# Patient Record
Sex: Male | Born: 1980 | Race: White | Hispanic: No | Marital: Married | Smoking: Never smoker
Health system: Southern US, Community
[De-identification: ages and names within clinical notes are randomized; demographics above are authoritative.]

## PROBLEM LIST (undated history)

## (undated) HISTORY — PX: COSMETIC SURGERY: SHX468

## (undated) HISTORY — PX: APPENDECTOMY: SHX54

## (undated) HISTORY — PX: OTHER SURGICAL HISTORY: SHX169

---
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
## 1999-03-22 ENCOUNTER — Encounter: Payer: Self-pay | Admitting: Emergency Medicine

## 1999-03-22 ENCOUNTER — Emergency Department (HOSPITAL_COMMUNITY): Admission: EM | Admit: 1999-03-22 | Discharge: 1999-03-22 | Payer: Self-pay | Admitting: Emergency Medicine

## 2000-08-25 ENCOUNTER — Ambulatory Visit (HOSPITAL_BASED_OUTPATIENT_CLINIC_OR_DEPARTMENT_OTHER): Admission: RE | Admit: 2000-08-25 | Discharge: 2000-08-25 | Payer: Self-pay | Admitting: Orthopedic Surgery

## 2002-09-01 ENCOUNTER — Encounter: Payer: Self-pay | Admitting: Emergency Medicine

## 2002-09-02 ENCOUNTER — Inpatient Hospital Stay (HOSPITAL_COMMUNITY): Admission: EM | Admit: 2002-09-02 | Discharge: 2002-09-02 | Payer: Self-pay | Admitting: Emergency Medicine

## 2002-09-02 ENCOUNTER — Encounter (INDEPENDENT_AMBULATORY_CARE_PROVIDER_SITE_OTHER): Payer: Self-pay | Admitting: *Deleted

## 2003-03-07 ENCOUNTER — Emergency Department (HOSPITAL_COMMUNITY): Admission: EM | Admit: 2003-03-07 | Discharge: 2003-03-08 | Payer: Self-pay | Admitting: Emergency Medicine

## 2003-07-02 ENCOUNTER — Ambulatory Visit (HOSPITAL_COMMUNITY): Admission: RE | Admit: 2003-07-02 | Discharge: 2003-07-02 | Payer: Self-pay | Admitting: General Surgery

## 2003-07-02 ENCOUNTER — Encounter: Payer: Self-pay | Admitting: General Surgery

## 2003-11-25 ENCOUNTER — Emergency Department (HOSPITAL_COMMUNITY): Admission: EM | Admit: 2003-11-25 | Discharge: 2003-11-26 | Payer: Self-pay | Admitting: Emergency Medicine

## 2004-12-08 ENCOUNTER — Ambulatory Visit: Payer: Self-pay | Admitting: Family Medicine

## 2005-01-11 ENCOUNTER — Emergency Department (HOSPITAL_COMMUNITY): Admission: EM | Admit: 2005-01-11 | Discharge: 2005-01-11 | Payer: Self-pay | Admitting: Emergency Medicine

## 2005-03-25 ENCOUNTER — Ambulatory Visit: Payer: Self-pay | Admitting: Family Medicine

## 2006-12-16 ENCOUNTER — Ambulatory Visit: Payer: Self-pay | Admitting: Family Medicine

## 2006-12-17 ENCOUNTER — Ambulatory Visit: Payer: Self-pay | Admitting: Family Medicine

## 2006-12-17 LAB — CONVERTED CEMR LAB
Bilirubin, Direct: 0.1 mg/dL (ref 0.0–0.3)
CO2: 30 meq/L (ref 19–32)
Cholesterol: 150 mg/dL (ref 0–200)
Creatinine, Ser: 0.8 mg/dL (ref 0.4–1.5)
Direct LDL: 76.5 mg/dL
Potassium: 3.7 meq/L (ref 3.5–5.1)
Sodium: 141 meq/L (ref 135–145)
Total Bilirubin: 0.8 mg/dL (ref 0.3–1.2)
Total CHOL/HDL Ratio: 5.3
Total Protein: 6.5 g/dL (ref 6.0–8.3)
Triglycerides: 257 mg/dL (ref 0–149)
VLDL: 51 mg/dL — ABNORMAL HIGH (ref 0–40)

## 2009-11-27 ENCOUNTER — Ambulatory Visit: Payer: Self-pay | Admitting: Family Medicine

## 2009-11-27 LAB — CONVERTED CEMR LAB
Bilirubin Urine: NEGATIVE
Ketones, urine, test strip: NEGATIVE
WBC, UA: 0 cells/hpf
pH: 6

## 2010-10-16 NOTE — Assessment & Plan Note (Signed)
Summary: DOT PHYSICAL / LFW   Vital Signs:  Patient profile:   30 year old male Height:      74 inches Weight:      236.6 pounds BMI:     30.49 Temp:     98.2 degrees F oral Pulse rate:   76 / minute Pulse rhythm:   regular BP sitting:   120 / 82  (left arm) Cuff size:   regular  Vitals Entered By: Benny Lennert CMA Duncan Dull) (November 27, 2009 8:57 AM)  Vision Screening:Left eye w/o correction: 20 / 20 Right Eye w/o correction: 20 / 20 Both eyes w/o correction:  20/ 20  Color vision testing: normal   Ishmael Holter # 2: Pass     Vision Entered By: Benny Lennert CMA Duncan Dull) (November 27, 2009 8:58 AM)  Hearing Screen 25db HL: Left  500 hz: 25db 1000 hz: 25db 2000 hz: 25db 4000 hz: 25db Right  500 hz: 25db 1000 hz: 25db 2000 hz: 25db 4000 hz: 25db    History of Present Illness: Chief complaint dot physical  Preventive Screening-Counseling & Management  Alcohol-Tobacco     Smoking Status: never      Drug Use:  no.    Problems Prior to Update: 1)  Preventive Health Care  (ICD-V70.0)  Current Medications (verified): 1)  None  Allergies (verified): No Known Drug Allergies  Past History:  Past medical, surgical, family and social histories (including risk factors) reviewed, and no changes noted (except as noted below).  Past Surgical History: nose plastic surgery knee arthroscopic surgery, left  Appendectomy  Family History: Reviewed history and no changes required. father: healthy mother: passed away from Alzheimer's...age 63 siblings: healthy  Social History: Reviewed history and no changes required. Occupation: Visual merchandiser Married No children Never Smoked Alcohol use-yes, 1-2 drink a month Drug use-noNo exercise Diet: fruits and veggies, waterOccupation:  employed Smoking Status:  never Drug Use:  no  Review of Systems General:  Denies fatigue and fever. CV:  Denies chest pain or discomfort and difficulty breathing at night. Resp:  Denies  shortness of breath. GI:  Denies abdominal pain. GU:  Denies dysuria. MS:  some joint stiffness on colder days...uses glucosamine. Derm:  Denies lesion(s). Psych:  Denies anxiety and depression.  Physical Exam  General:  Well-developed,well-nourished,in no acute distress; alert,appropriate and cooperative throughout examination Head:  Normocephalic and atraumatic without obvious abnormalities. No apparent alopecia or balding. Eyes:  No corneal or conjunctival inflammation noted. EOMI. Perrla. Funduscopic exam benign, without hemorrhages, exudates or papilledema. Vision grossly normal. Ears:  External ear exam shows no significant lesions or deformities.  Otoscopic examination reveals clear canals, tympanic membranes are intact bilaterally without bulging, retraction, inflammation or discharge. Hearing is grossly normal bilaterally. Nose:  External nasal examination shows no deformity or inflammation. Nasal mucosa are pink and moist without lesions or exudates. Mouth:  Oral mucosa and oropharynx without lesions or exudates.  Teeth in good repair. Neck:  no carotid bruit or thyromegaly no cervical or supraclavicular lymphadenopathy  Lungs:  Normal respiratory effort, chest expands symmetrically. Lungs are clear to auscultation, no crackles or wheezes. Heart:  Normal rate and regular rhythm. S1 and S2 normal without gallop, murmur, click, rub or other extra sounds. Abdomen:  Bowel sounds positive,abdomen soft and non-tender without masses, organomegaly or hernias noted. Genitalia:  Testes bilaterally descended without nodularity, tenderness or masses. No scrotal masses or lesions. No penis lesions or urethral discharge. Msk:  No deformity or scoliosis noted of thoracic or lumbar  spine.   Pulses:  R and L posterior tibial pulses are full and equal bilaterally  Extremities:  no edema Neurologic:  No cranial nerve deficits noted. Station and gait are normal. DTRs are symmetrical throughout. Sensory,  motor and coordinative functions appear intact. Skin:  Intact without suspicious lesions or rashes Psych:  Cognition and judgment appear intact. Alert and cooperative with normal attention span and concentration. No apparent delusions, illusions, hallucinations   Impression & Recommendations:  Problem # 1:  PREVENTIVE HEALTH CARE (ICD-V70.0)  The patient's preventative maintenance and recommended screening tests for an annual wellness exam were reviewed in full today. Brought up to date unless services declined.  Counselled on the importance of diet, exercise, and its role in overall health and mortality. The patient's FH and SH was reviewed, including their home life, tobacco status, and drug and alcohol status.     Orders: UA Dipstick w/o Micro (manual) (21308)  Prior Medications (reviewed today): None Current Allergies (reviewed today): No known allergies    Laboratory Results   Urine Tests  Date/Time Received: November 27, 2009 9:58 AM  Date/Time Reported: November 27, 2009 9:58 AM   Routine Urinalysis   Color: yellow Appearance: Clear Glucose: negative   (Normal Range: Negative) Bilirubin: negative   (Normal Range: Negative) Ketone: negative   (Normal Range: Negative) Spec. Gravity: <1.005   (Normal Range: 1.003-1.035) Blood: trace-lysed   (Normal Range: Negative) pH: 6.0   (Normal Range: 5.0-8.0) Protein: negative   (Normal Range: Negative) Urobilinogen: 0.2   (Normal Range: 0-1) Nitrite: negative   (Normal Range: Negative) Leukocyte Esterace: negative   (Normal Range: Negative)  Urine Microscopic WBC/HPF: 0 RBC/HPF: 0

## 2011-01-30 NOTE — H&P (Signed)
NAMEHUTTON, [name]                           ACCOUNT NO.:  [id number]   MEDICAL RECORD NO.:  [id number]                   PATIENT TYPE:  EMS   LOCATION:  ED                                   FACILITY:  [facility]   PHYSICIAN:  Adolph Pollack, M.D.            DATE OF BIRTH:  December 14, 1980   DATE OF ADMISSION:  09/01/2002  DATE OF DISCHARGE:                                HISTORY & PHYSICAL   CHIEF COMPLAINT:  Epigastric pain that radiated to his lower abdomen.   HISTORY OF PRESENT ILLNESS:  The patient is a 30 year old male who awoke  this morning with a sharp epigastric pain.  He tried to take some antacids  but this failed to make him feel better.  The pain then radiated to his  lower abdomen and it was associated with nausea, vomiting, and subjective  fever.  He eventually presented to the emergency department in the afternoon  and underwent evaluation.  A CT scan was performed which was consistent with  appendicitis and I was subsequently called to see him at approximately 11:10  p.m.   PAST MEDICAL HISTORY:  Left ACL injury.   PAST SURGICAL HISTORY:  Left ACL repair; rhinoplasty.   DRUG ALLERGIES:  None.   MEDICATIONS:  None.   SOCIAL HISTORY:  No tobacco use.  Occasionally has an alcoholic beverage.  He is a Visual merchandiser.   REVIEW OF SYSTEMS:  CARDIOVASCULAR:  No hypertension, heart disease.  PULMONARY:  No pneumonia or asthma or tuberculosis.  GI:  No peptic ulcer  disease.  He has had intermittent problems with his stomach bothering him  which he thinks is like acid reflux.  No diverticulitis.  GU:  No kidney  stones or bladder infection.  ENDOCRINE:  No thyroid disease or diabetes.  HEMATOLOGIC:  No transfusions or deep venous thrombosis.   PHYSICAL EXAMINATION:  GENERAL:  An ill-appearing male.  VITAL SIGNS:  At presentation, his initial temperature was 97 degrees but he  does feel warmer now.  Blood pressure normal.  He is slightly tachycardic.  SKIN:  Flushed and with  increased warmth.  EYES:  Extraocular muscles intact.  No icterus.  NECK:  Supple  without palpable masses.  CARDIOVASCULAR:  Increased rate, regular rhythm.  RESPIRATORY:  Breath sounds are equal and clear respirations __________  .  ABDOMEN:  Soft with right lower quadrant tenderness to both palpation and  percussion.  No palpable masses.  Hypoactive bowel sounds noted.  EXTREMITIES:  No cyanosis or edema.  Left knee scar noted.   LABORATORY DATA:  Urinalysis negative.  White blood cell count 12,500,  hemoglobin 17.3.  CMET normal.   IMAGING STUDIES:  CT scan was reviewed and demonstrates a sigmoid  diverticulosis.  There is an appendiceal dilatation with periappendiceal  inflammatory changes.   IMPRESSION:  Acute appendicitis.   PLAN:  IV antibiotics and have laparoscopic possible open appendectomy.  I  did explain the procedure, rationale, and risks to him and his father.  The  risks include but are not limited to bleeding, infection, actual damage  could occur to abdominal organs such as intestine and bladder, and the risk  of general anesthesia.  They seem to understand this and agree to proceed.                                               Adolph Pollack, M.D.    Kari Baars  D:  09/02/2002  T:  09/02/2002  Job:  [id number]

## 2011-01-30 NOTE — Op Note (Signed)
[name], [name]                           ACCOUNT NO.:  [id number]   MEDICAL RECORD NO.:  [id number]                   PATIENT TYPE:  EMS   LOCATION:  ED                                   FACILITY:  [facility]   PHYSICIAN:  Adolph Pollack, M.D.            DATE OF BIRTH:  May 07, 1981   DATE OF PROCEDURE:  09/02/2002  DATE OF DISCHARGE:                                 OPERATIVE REPORT   PREOPERATIVE DIAGNOSES:  Acute appendicitis.   POSTOPERATIVE DIAGNOSES:  Acute appendicitis.   PROCEDURE:  Laparoscopic appendectomy.   SURGEON:  Adolph Pollack, M.D.   ANESTHESIA:  General.   INDICATIONS FOR PROCEDURE:  [name] is a 30 year old male who woke the  morning of September 01, 2002 feeling poorly with epigastric pain that  radiated to the right lower quadrant. He presented to the emergency room and  was found to have acute appendicitis and now presents for laparoscopic  appendectomy. The procedure and the risks were explained to him and his  father preoperatively.   TECHNIQUE:  He was brought to the operating room, placed supine on the  operating table and a general anesthetic was administered. A Foley catheter  was placed in his bladder. His abdominal wall was partially shaven,  sterilely prepped and draped. Marcaine was then infiltrated in the  subumbilical region and a small subumbilical incision was made through the  skin and subcutaneous tissue down to the midline fascia. A 1 to 1.5 cm  incision was made in the midline fascia, the peritoneal cavity was entered  bluntly and under direct vision. A pursestring suture of #0 Vicryl was  placed around the fascial edges. A Hasson trocar was introduced into the  peritoneal cavity and pneumoperitoneum was created by insufflation of CO2  gas. Next, the laparoscope was introduced. He was then placed in  Trendelenburg position and rotated to the left. A 5 mm trocar was placed in  the left lower quadrant region and the appendix was  visualized and noted to  be inflamed especially the distal portion of it. A 5 mm trocar was then  placed in the right upper quadrant and the mesoappendix was grasped. The  mesoappendix was then divided with the harmonic scalpel down toward the base  of the appendix. I then used the EndoGIA stapler to amputate the appendix  from the cecum. The staple line was hemostatic.   The appendix was placed in an Endopouch bag and then removed through the  subumbilical port. The subumbilical trocar was replaced and the right lower  quadrant area was copiously irrigated out with saline. Once gain the staple  line was inspected and was solid without evidence of bleeding. The  irrigation fluid was evacuated.   Next, the trocars were removed and the pneumoperitoneum was released. The  subumbilical fascial defect was closed by tightening up and tying down the  pursestring suture. The skin incisions were  then closed with 4-0 Monocryl  subcuticular stitches. Steri-Strips and sterile dressings were applied.   He tolerated the procedure well without any apparent complications and was  taken to the recovery room in satisfactory condition.                                               Adolph Pollack, M.D.    Kari Baars  D:  09/02/2002  T:  09/02/2002  Job:  [id number]

## 2011-12-15 ENCOUNTER — Encounter: Payer: Self-pay | Admitting: Family Medicine

## 2011-12-16 ENCOUNTER — Encounter: Payer: Self-pay | Admitting: Family Medicine

## 2011-12-16 ENCOUNTER — Ambulatory Visit (INDEPENDENT_AMBULATORY_CARE_PROVIDER_SITE_OTHER): Payer: BC Managed Care – PPO | Admitting: Family Medicine

## 2011-12-16 VITALS — BP 118/80 | HR 97 | Temp 97.5°F | Ht 74.0 in | Wt 228.4 lb

## 2011-12-16 DIAGNOSIS — Z0289 Encounter for other administrative examinations: Secondary | ICD-10-CM

## 2011-12-16 DIAGNOSIS — Z0000 Encounter for general adult medical examination without abnormal findings: Secondary | ICD-10-CM

## 2011-12-16 LAB — POCT URINALYSIS DIPSTICK
Blood, UA: NEGATIVE
Glucose, UA: NEGATIVE
Leukocytes, UA: NEGATIVE

## 2011-12-16 NOTE — Progress Notes (Signed)
Patient Name: [name] Date of Birth: March 07, 1981 Age: 31 y.o. Medical Record Number: [id number] Gender: male Date of Encounter: 12/16/2011  History of Present Illness:  [name] is a 31 y.o. very pleasant male patient who presents with the following:  Drives locally for himself  Preventative Health Maintenance Visit:  Health Maintenance Summary Reviewed and updated, unless pt declines services.  Tobacco History Reviewed. Alcohol: No concerns, no excessive use Exercise Habits: Some activity, rec at least 30 mins 5 times a week STD concerns: no risk or activity to increase risk Drug Use: None Encouraged self-testicular check  Past Medical History, Surgical History, Social History, Family History, Problem List, Medications, and Allergies have been reviewed and updated if relevant.  Review of Systems:  General: Denies fever, chills, sweats. No significant weight loss. Eyes: Denies blurring,significant itching ENT: Denies earache, sore throat, and hoarseness. Cardiovascular: Denies chest pains, palpitations, dyspnea on exertion Respiratory: Denies cough, dyspnea at rest,wheeezing Breast: no concerns about lumps GI: Denies nausea, vomiting, diarrhea, constipation, change in bowel habits, abdominal pain, melena, hematochezia GU: Denies penile discharge, ED, urinary flow / outflow problems. No STD concerns. Musculoskeletal: Denies back pain, joint pain Derm: Denies rash, itching Neuro: Denies  paresthesias, frequent falls, frequent headaches Psych: Denies depression, anxiety Endocrine: Denies cold intolerance, heat intolerance, polydipsia Heme: Denies enlarged lymph nodes Allergy: No hayfever   Physical Examination: Filed Vitals:   12/16/11 1452  BP: 118/80  Pulse: 97  Temp: 97.5 F (36.4 C)  TempSrc: Oral  Height: 6\' 2"  (1.88 m)  Weight: 228 lb 6.4 oz (103.602 kg)  SpO2: 97%    Body mass index is 29.32 kg/(m^2).   Wt Readings from Last 3 Encounters:    12/16/11 228 lb 6.4 oz (103.602 kg)  11/27/09 236 lb 9.6 oz (107.321 kg)    GEN: well developed, well nourished, no acute distress Eyes: conjunctiva and lids normal, PERRLA, EOMI ENT: TM clear, nares clear, oral exam WNL Neck: supple, no lymphadenopathy, no thyromegaly, no JVD Pulm: clear to auscultation and percussion, respiratory effort normal CV: regular rate and rhythm, S1-S2, no murmur, rub or gallop, no bruits, peripheral pulses normal and symmetric, no cyanosis, clubbing, edema or varicosities Chest: no scars, masses GI: soft, non-tender; no hepatosplenomegaly, masses; active bowel sounds all quadrants GU: no hernia, testicular mass, penile discharge, or prostate enlargement Lymph: no cervical, axillary or inguinal adenopathy MSK: gait normal, muscle tone and strength WNL, no joint swelling, effusions, discoloration, crepitus  SKIN: clear, good turgor, color WNL, no rashes, lesions, or ulcerations Neuro: normal mental status, normal strength, sensation, and motion Psych: alert; oriented to person, place and time, normally interactive and not anxious or depressed in appearance.   Assessment and Plan:  HM: The patient's preventative maintenance and recommended screening tests for an annual wellness exam were reviewed in full today. Brought up to date unless services declined.  Counselled on the importance of diet, exercise, and its role in overall health and mortality. The patient's FH and SH was reviewed, including their home life, tobacco status, and drug and alcohol status.   DOT forms completed.  Urinalysis    Component Value Date/Time   COLORURINE yellow 11/27/2009 0847   APPEARANCEUR Clear 11/27/2009 0847   LABSPEC <1.005 11/27/2009 0847   PHURINE 6.0 11/27/2009 0847   HGBUR trace-lysed 11/27/2009 0847   BILIRUBINUR neg 12/16/2011 1457   BILIRUBINUR negative 11/27/2009 0847   UROBILINOGEN negative 12/16/2011 1457   UROBILINOGEN 0.2 11/27/2009 0847   NITRITE neg 12/16/2011  1457   NITRITE negative 11/27/2009 0847   LEUKOCYTESUR Negative 12/16/2011 1457

## 2013-01-24 ENCOUNTER — Emergency Department (HOSPITAL_COMMUNITY): Payer: BC Managed Care – PPO

## 2013-01-24 ENCOUNTER — Encounter (HOSPITAL_COMMUNITY): Payer: Self-pay | Admitting: Emergency Medicine

## 2013-01-24 ENCOUNTER — Emergency Department (HOSPITAL_COMMUNITY)
Admission: EM | Admit: 2013-01-24 | Discharge: 2013-01-25 | Disposition: A | Discharge status: Home / Self Care | Payer: BC Managed Care – PPO | Attending: Emergency Medicine | Admitting: Emergency Medicine

## 2013-01-24 DIAGNOSIS — Y9389 Activity, other specified: Secondary | ICD-10-CM | POA: Insufficient documentation

## 2013-01-24 DIAGNOSIS — Z79899 Other long term (current) drug therapy: Secondary | ICD-10-CM | POA: Insufficient documentation

## 2013-01-24 DIAGNOSIS — S61411A Laceration without foreign body of right hand, initial encounter: Secondary | ICD-10-CM

## 2013-01-24 DIAGNOSIS — Y9289 Other specified places as the place of occurrence of the external cause: Secondary | ICD-10-CM | POA: Insufficient documentation

## 2013-01-24 DIAGNOSIS — S61409A Unspecified open wound of unspecified hand, initial encounter: Secondary | ICD-10-CM | POA: Insufficient documentation

## 2013-01-24 DIAGNOSIS — S61209A Unspecified open wound of unspecified finger without damage to nail, initial encounter: Secondary | ICD-10-CM | POA: Insufficient documentation

## 2013-01-24 DIAGNOSIS — W268XXA Contact with other sharp object(s), not elsewhere classified, initial encounter: Secondary | ICD-10-CM | POA: Insufficient documentation

## 2013-01-24 MED ORDER — SODIUM CHLORIDE 0.9 % IV SOLN: Freq: Once | INTRAVENOUS | Status: DC

## 2013-01-24 MED ORDER — CEFAZOLIN SODIUM 1-5 GM-% IV SOLN
1.0000 g | Freq: Once | INTRAVENOUS | Status: AC
  Administered 2013-01-25: 1 g via INTRAVENOUS
  Filled 2013-01-24: qty 50

## 2013-01-24 MED ORDER — TETANUS-DIPHTH-ACELL PERTUSSIS 5-2.5-18.5 LF-MCG/0.5 IM SUSP
0.5000 mL | Freq: Once | INTRAMUSCULAR | Status: DC
  Filled 2013-01-24: qty 0.5

## 2013-01-24 NOTE — ED Notes (Signed)
PT. 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

## 2013-01-24 NOTE — ED Provider Notes (Signed)
History     CSN: [id number]  Arrival date & time 01/24/13  2122   First MD Initiated Contact with Patient 01/24/13 2305      Chief Complaint  Patient presents with  . Laceration    (Consider location/radiation/quality/duration/timing/severity/associated sxs/prior treatment) HPI This is a 32 year old male who was changing the blade of a tiller about 8:30 PM today. His right slipped and his right middle finger was cut by the tiller blade. He has a laceration at the right third metacarpophalangeal joint on the palmar side. Tendon function is intact distally. Sensation is decreased distally. The wound is hemostatic. There is minimal pain associated with it.  History reviewed. No pertinent past medical history.  Past Surgical History  Procedure Laterality Date  . Cosmetic surgery      NOSE  . Appendectomy    . Knee surgery      Family History  Problem Relation Age of Onset  . Alzheimer's disease Mother     History  Substance Use Topics  . Smoking status: Never Smoker   . Smokeless tobacco: Not on file  . Alcohol Use: Yes      Review of Systems  All other systems reviewed and are negative.    Allergies  Review of patient's allergies indicates no known allergies.  Home Medications   Current Outpatient Rx  Name  Route  Sig  Dispense  Refill  . cetirizine (ZYRTEC) 10 MG tablet   Oral   Take 10 mg by mouth daily.         Marland Kitchen guaiFENesin (MUCINEX) 600 MG 12 hr tablet   Oral   Take 1,200 mg by mouth 2 (two) times daily.           BP 124/79  Pulse 73  Temp(Src) 98.2 F (36.8 C) (Oral)  Resp 16  SpO2 98%  Physical Exam General: Well-developed, well-nourished male in no acute distress; appearance consistent with age of record HENT: normocephalic, atraumatic Eyes: pupils equal round and reactive to light; extraocular muscles intact Neck: supple Heart: regular rate and rhythm Lungs: clear to auscultation bilaterally Abdomen: soft;  nondistended Extremities: No deformity; full range of motion; laceration at the right third metacarpophalangeal joint on the palmar side, flexor tendon function intact at the proximal and distal interphalangeal joints, sensation is decreased distally, distal capillary refill is brisk Neurologic: Awake, alert and oriented; motor function intact in all extremities and symmetric; no facial droop Skin: Warm and dry Psychiatric: Normal mood and affect    ED Course  Procedures (including critical care time)    MDM  11:24 PM Ancef 1 g IV ordered. PA will suture wound.        Hanley Seamen, MD 01/24/13 2325

## 2013-01-24 NOTE — ED Notes (Signed)
Did pt wound care 11:21 pm JG.

## 2013-01-25 NOTE — ED Provider Notes (Signed)
LACERATION REPAIR Performed by: Jaci Carrel Authorized by: Jaci Carrel Consent: Verbal consent obtained. Risks and benefits: risks, benefits and alternatives were discussed Consent given by: patient Patient identity confirmed: provided demographic data Prepped and Draped in normal sterile fashion Wound explored  Laceration Location: palmer surface middle digit  Laceration Length: 2cm  No Foreign Bodies seen or palpated  Anesthesia: local infiltration  Local anesthetic: lidocaine % no epinephrine  Anesthetic total: 2 ml  Irrigation method: syringe Amount of cleaning: standard  Skin closure: 3.0 prolene  Number of sutures: 7  Technique: simple interupted  Patient tolerance: Patient tolerated the procedure well with no immediate complications.   Jaci Carrel, [place] 01/25/13 [phone]

## 2013-01-25 NOTE — ED Notes (Signed)
Pt discharged during system downtime. Please see flow chart.

## 2015-01-12 IMAGING — CR DG FINGER MIDDLE 2+V*R*
3 series · 3 of 3 positions shown · non-contrast
Comparison: None.

CLINICAL DATA: Laceration right middle finger

RIGHT MIDDLE FINGER 2+V

[x finger pa right]
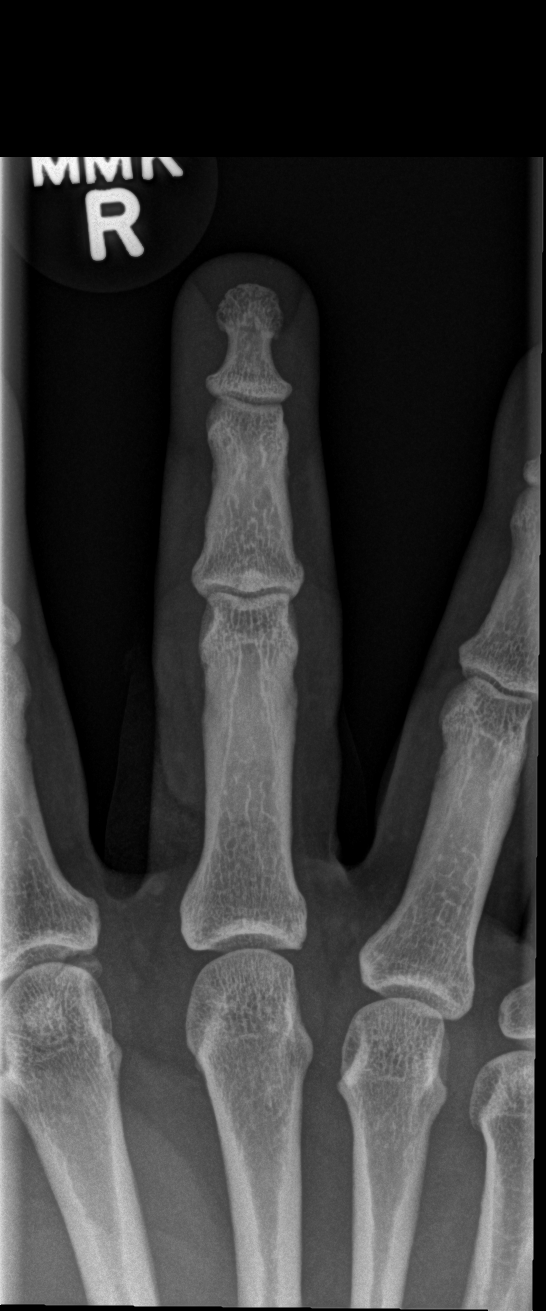

[x finger obl right]
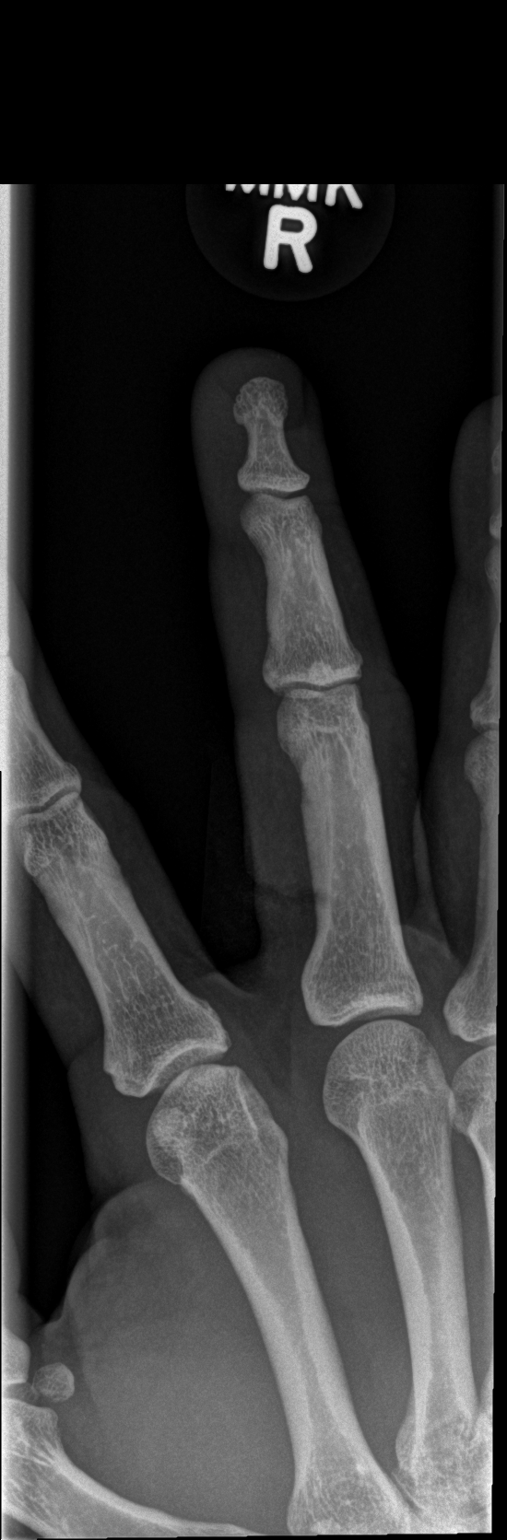

[x finger lat right]
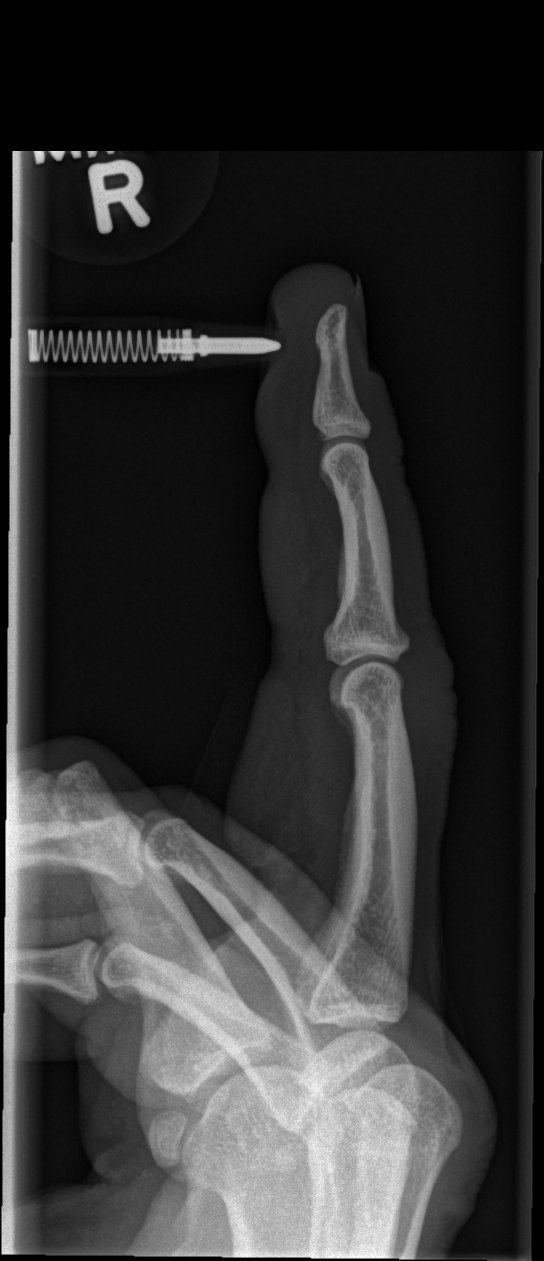

[3 of 3 positions shown; findings below may reference images not displayed]

FINDINGS: No displaced fracture or dislocation.  No aggressive
osseous lesions. Soft tissue irregularity along the radial margin
overlies the proximal phalanx. No radiopaque foreign body.
IMPRESSION: No acute osseous finding of the right hand third digit.

## 2021-08-18 ENCOUNTER — Ambulatory Visit (HOSPITAL_COMMUNITY)
Admission: EM | Admit: 2021-08-18 | Discharge: 2021-08-18 | Disposition: A | Discharge status: Home / Self Care | Payer: BC Managed Care – PPO | Attending: Emergency Medicine | Admitting: Emergency Medicine

## 2021-08-18 ENCOUNTER — Encounter (HOSPITAL_COMMUNITY): Payer: Self-pay | Admitting: Emergency Medicine

## 2021-08-18 ENCOUNTER — Other Ambulatory Visit: Payer: Self-pay

## 2021-08-18 DIAGNOSIS — R42 Dizziness and giddiness: Secondary | ICD-10-CM | POA: Diagnosis present

## 2021-08-18 LAB — COMPREHENSIVE METABOLIC PANEL
ALT: 34 U/L (ref 0–44)
AST: 24 U/L (ref 15–41)
Albumin: 3.8 g/dL (ref 3.5–5.0)
Alkaline Phosphatase: 87 U/L (ref 38–126)
Anion gap: 6 (ref 5–15)
BUN: 10 mg/dL (ref 6–20)
CO2: 29 mmol/L (ref 22–32)
Calcium: 9.4 mg/dL (ref 8.9–10.3)
Chloride: 102 mmol/L (ref 98–111)
Creatinine, Ser: 0.79 mg/dL (ref 0.61–1.24)
GFR, Estimated: >60 mL/min (ref >60)
Glucose, Bld: 112 mg/dL — ABNORMAL HIGH (ref 70–99)
Potassium: 4.2 mmol/L (ref 3.5–5.1)
Sodium: 137 mmol/L (ref 135–145)
Total Bilirubin: 0.5 mg/dL (ref 0.3–1.2)
Total Protein: 6.9 g/dL (ref 6.5–8.1)

## 2021-08-18 LAB — TSH: TSH: 1.24 u[IU]/mL (ref 0.350–4.500)

## 2021-08-18 MED ORDER — MECLIZINE HCL 12.5 MG PO TABS: 12.5000 mg | ORAL_TABLET | Freq: Three times a day (TID) | ORAL | 0 refills | Status: DC | PRN

## 2021-08-18 NOTE — ED Triage Notes (Signed)
Pt been feeling fatigued, dizziness with movement and "fel in state of fog" that started this morning around 830am Reports had diarrhea and not feeling well since Thursday.

## 2021-08-18 NOTE — ED Provider Notes (Signed)
[facility]    CSN: [id number] Arrival date & time: 08/18/21  1100      History   Chief Complaint No chief complaint on file.   HPI [name] is a 40 y.o. male.   Patient presents with dizziness began this morning, feels as if he is moving, feels off balanced.  Started abruptly while doing normal day-to-day activities.  Endorses lightheadedness, mild headache. Has not attempted treatment. No pertinent medical history. Denies blurred vision, floater, fever, chills, URI symptoms, ear pain. Recent bout of diarrhea,2-3 days, resolved. Endorses good intake of food and fluids.   History reviewed. No pertinent past medical history.  There are no problems to display for this patient.   Past Surgical History:  Procedure Laterality Date   APPENDECTOMY     COSMETIC SURGERY     NOSE   KNEE SURGERY         Home Medications    Prior to Admission medications   Medication Sig Start Date End Date Taking? Authorizing Provider  cetirizine (ZYRTEC) 10 MG tablet Take 10 mg by mouth daily.    [provider]  guaiFENesin (MUCINEX) 600 MG 12 hr tablet Take 1,200 mg by mouth 2 (two) times daily.    [provider]    Family History Family History  Problem Relation Age of Onset   Alzheimer's disease Mother     Social History Social History   Tobacco Use   Smoking status: Never   Smokeless tobacco: Current    Types: Chew  Substance Use Topics   Alcohol use: Yes   Drug use: No     Allergies   Patient has no known allergies.   Review of Systems Review of Systems  Constitutional: Negative.   HENT:  Positive for congestion. Negative for dental problem, drooling, ear discharge, ear pain, facial swelling, hearing loss, mouth sores, nosebleeds, postnasal drip, rhinorrhea, sinus pressure, sinus pain, sneezing, sore throat, tinnitus, trouble swallowing and voice change.   Respiratory: Negative.    Cardiovascular: Negative.   Gastrointestinal:  Negative.   Skin: Negative.   Neurological:  Positive for dizziness, light-headedness and headaches. Negative for tremors, seizures, syncope, facial asymmetry, speech difficulty, weakness and numbness.    Physical Exam Triage Vital Signs ED Triage Vitals  Enc Vitals Group     BP 08/18/21 1202 132/84     Pulse Rate 08/18/21 1202 92     Resp 08/18/21 1202 17     Temp 08/18/21 1202 98.4 F (36.9 C)     Temp src --      SpO2 08/18/21 1202 98 %     Weight --      Height --      Head Circumference --      Peak Flow --      Pain Score 08/18/21 1200 0     Pain Loc --      Pain Edu? --      Excl. in GC? --    No data found.  Updated Vital Signs BP 132/84 (BP Location: Left Arm)   Pulse 92   Temp 98.4 F (36.9 C)   Resp 17   SpO2 98%   Visual Acuity Right Eye Distance:   Left Eye Distance:   Bilateral Distance:    Right Eye Near:   Left Eye Near:    Bilateral Near:     Physical Exam Constitutional:      Appearance: Normal appearance. He is normal weight.  HENT:  Right Ear: Tympanic membrane, ear canal and external ear normal.     Left Ear: Tympanic membrane, ear canal and external ear normal.  Eyes:     Extraocular Movements: Extraocular movements intact.  Cardiovascular:     Rate and Rhythm: Normal rate and regular rhythm.     Pulses: Normal pulses.     Heart sounds: Normal heart sounds.  Pulmonary:     Effort: Pulmonary effort is normal.     Breath sounds: Normal breath sounds.  Skin:    General: Skin is warm and dry.  Neurological:     General: No focal deficit present.     Mental Status: He is alert and oriented to person, place, and time. Mental status is at baseline.  Psychiatric:        Mood and Affect: Mood normal.        Behavior: Behavior normal.     UC Treatments / Results  Labs (all labs ordered are listed, but only abnormal results are displayed) Labs Reviewed - No data to display  EKG   Radiology No results  found.  Procedures Procedures (including critical care time)  Medications Ordered in UC Medications - No data to display  Initial Impression / Assessment and Plan / UC Course  I have reviewed the triage vital signs and the nursing notes.  Pertinent labs & imaging results that were available during my care of the patient were reviewed by me and considered in my medical decision making (see chart for details).  Dizziness  Vital signs are stable, neurological exam intact, patient in no signs of distress, etiology of symptoms unknown at this time, discussed with patient, will will do CMP and TSH, labs pending, orthostatic vitals negative, PCP referral placed with recommended follow-up for persistent symptoms, prescribed meclizine 12.5 mg 3 times daily as needed for treatment, patient to follow-up in urgent care as needed Final Clinical Impressions(s) / UC Diagnoses   Final diagnoses:  None   Discharge Instructions   None    ED Prescriptions   None    PDMP not reviewed this encounter.   Valinda Hoar, NP 08/18/21 1304

## 2021-08-18 NOTE — Discharge Instructions (Addendum)
The cause of your dizziness is unknown at this time, we will rule out  Basic causes   Your lab work is pending which is checking your electrolytes, kidney, liver and thyroid, if is concerning notified about how to move forward  There were no changes in your blood pressure management  Your Neurological exam had no abnormalities  You may use meclizine 3 times a day as needed to help manage dizziness  If your symptoms persist you will need a primary doctor to follow-up with a referral has been placed for you, at any point if your symptoms worsen you emergency department for further evaluation

## 2021-10-13 ENCOUNTER — Ambulatory Visit: Payer: BC Managed Care – PPO | Admitting: Family Medicine

## 2021-10-13 ENCOUNTER — Encounter (INDEPENDENT_AMBULATORY_CARE_PROVIDER_SITE_OTHER): Payer: Self-pay

## 2021-10-13 ENCOUNTER — Other Ambulatory Visit: Payer: Self-pay

## 2021-10-13 ENCOUNTER — Encounter: Payer: Self-pay | Admitting: Family Medicine

## 2021-10-13 VITALS — BP 134/89 | HR 107 | Temp 98.0°F | Resp 16 | Ht 74.0 in | Wt 238.0 lb

## 2021-10-13 DIAGNOSIS — Z0000 Encounter for general adult medical examination without abnormal findings: Secondary | ICD-10-CM | POA: Diagnosis not present

## 2021-10-13 DIAGNOSIS — Z1329 Encounter for screening for other suspected endocrine disorder: Secondary | ICD-10-CM

## 2021-10-13 DIAGNOSIS — Z130 Encounter for screening for diseases of the blood and blood-forming organs and certain disorders involving the immune mechanism: Secondary | ICD-10-CM

## 2021-10-13 DIAGNOSIS — Z23 Encounter for immunization: Secondary | ICD-10-CM | POA: Diagnosis not present

## 2021-10-13 DIAGNOSIS — Z13220 Encounter for screening for lipoid disorders: Secondary | ICD-10-CM

## 2021-10-13 DIAGNOSIS — Z7689 Persons encountering health services in other specified circumstances: Secondary | ICD-10-CM

## 2021-10-13 DIAGNOSIS — Z1159 Encounter for screening for other viral diseases: Secondary | ICD-10-CM

## 2021-10-13 NOTE — Progress Notes (Signed)
Follow up from Urgent Care

## 2021-10-14 LAB — HEPATITIS C ANTIBODY: Hep C Virus Ab: <0.1 s/co ratio (ref 0.0–0.9)

## 2021-10-14 LAB — CMP14+EGFR
ALT: 22 IU/L (ref 0–44)
AST: 17 IU/L (ref 0–40)
Albumin/Globulin Ratio: 2.1 (ref 1.2–2.2)
Albumin: 4.6 g/dL (ref 4.0–5.0)
Alkaline Phosphatase: 108 IU/L (ref 44–121)
BUN/Creatinine Ratio: 14 (ref 9–20)
BUN: 13 mg/dL (ref 6–24)
Bilirubin Total: 0.3 mg/dL (ref 0.0–1.2)
CO2: 21 mmol/L (ref 20–29)
Calcium: 9.5 mg/dL (ref 8.7–10.2)
Chloride: 102 mmol/L (ref 96–106)
Creatinine, Ser: 0.95 mg/dL (ref 0.76–1.27)
Globulin, Total: 2.2 g/dL (ref 1.5–4.5)
Glucose: 96 mg/dL (ref 70–99)
Potassium: 4.2 mmol/L (ref 3.5–5.2)
Sodium: 141 mmol/L (ref 134–144)
Total Protein: 6.8 g/dL (ref 6.0–8.5)
eGFR: 103 mL/min/{1.73_m2} (ref >59)

## 2021-10-14 LAB — LIPID PANEL
Chol/HDL Ratio: 5.5 ratio — ABNORMAL HIGH (ref 0.0–5.0)
Cholesterol, Total: 183 mg/dL (ref 100–199)
HDL: 33 mg/dL — ABNORMAL LOW (ref >39)
LDL Chol Calc (NIH): 69 mg/dL (ref 0–99)
Triglycerides: 522 mg/dL — ABNORMAL HIGH (ref 0–149)
VLDL Cholesterol Cal: 81 mg/dL — ABNORMAL HIGH (ref 5–40)

## 2021-10-14 LAB — CBC WITH DIFFERENTIAL/PLATELET
Basophils Absolute: 0 10*3/uL (ref 0.0–0.2)
Basos: 0 %
EOS (ABSOLUTE): 0.2 10*3/uL (ref 0.0–0.4)
Eos: 2 %
Hematocrit: 47.5 % (ref 37.5–51.0)
Hemoglobin: 16.3 g/dL (ref 13.0–17.7)
Immature Grans (Abs): 0 10*3/uL (ref 0.0–0.1)
Immature Granulocytes: 0 %
Lymphocytes Absolute: 1.6 10*3/uL (ref 0.7–3.1)
Lymphs: 23 %
MCH: 29.3 pg (ref 26.6–33.0)
MCHC: 34.3 g/dL (ref 31.5–35.7)
MCV: 85 fL (ref 79–97)
Monocytes Absolute: 0.7 10*3/uL (ref 0.1–0.9)
Monocytes: 9 %
Neutrophils Absolute: 4.6 10*3/uL (ref 1.4–7.0)
Neutrophils: 66 %
Platelets: 272 10*3/uL (ref 150–450)
RBC: 5.57 x10E6/uL (ref 4.14–5.80)
RDW: 13.2 % (ref 11.6–15.4)
WBC: 7.1 10*3/uL (ref 3.4–10.8)

## 2021-10-14 LAB — HEMOGLOBIN A1C
Est. average glucose Bld gHb Est-mCnc: 117 mg/dL
Hgb A1c MFr Bld: 5.7 % — ABNORMAL HIGH (ref 4.8–5.6)

## 2021-10-14 LAB — TSH: TSH: 2.58 u[IU]/mL (ref 0.450–4.500)

## 2021-10-15 ENCOUNTER — Encounter: Payer: Self-pay | Admitting: Family Medicine

## 2021-10-15 ENCOUNTER — Other Ambulatory Visit: Payer: Self-pay | Admitting: Family Medicine

## 2021-10-15 MED ORDER — ROSUVASTATIN CALCIUM 20 MG PO TABS: 20.0000 mg | ORAL_TABLET | Freq: Every day | ORAL | 0 refills | Status: DC

## 2021-10-15 MED ORDER — FENOFIBRATE 145 MG PO TABS: 145.0000 mg | ORAL_TABLET | Freq: Every day | ORAL | 0 refills | Status: DC

## 2021-10-15 NOTE — Progress Notes (Signed)
To    New Patient Office Visit  Subjective:  Patient ID: [name], male    DOB: 1980/09/22  Age: 41 y.o. MRN: [id number]  CC:  Chief Complaint  Patient presents with   New Patient (Initial Visit)   Establish Care    HPI [name] presents for to establish care and for routine annual exam. Denies acute complaints or concerns.   History reviewed. No pertinent past medical history.  Past Surgical History:  Procedure Laterality Date   APPENDECTOMY     COSMETIC SURGERY     NOSE   KNEE SURGERY      Family History  Problem Relation Age of Onset   Alzheimer's disease Mother    Diabetes Father    Alcohol abuse Father     Social History   Socioeconomic History   Marital status: Single    Spouse name: Not on file   Number of children: 0   Years of education: Not on file   Highest education level: Not on file  Occupational History   Occupation: Psychologist, sport and exercise  Tobacco Use   Smoking status: Never   Smokeless tobacco: Current    Types: Chew  Substance and Sexual Activity   Alcohol use: Yes   Drug use: No   Sexual activity: Not on file  Other Topics Concern   Not on file  Social History Narrative   Regular exercise- no   Diet: Fruits and veggies, water   Social Determinants of Health   Financial Resource Strain: Not on file  Food Insecurity: Not on file  Transportation Needs: Not on file  Physical Activity: Not on file  Stress: Not on file  Social Connections: Not on file  Intimate Partner Violence: Not on file    ROS Review of Systems  All other systems reviewed and are negative.  Objective:   Today's Vitals: BP 134/89 (BP Location: Left Arm, Patient Position: Sitting, Cuff Size: Large)    Pulse (!) 107    Temp 98 F (36.7 C)    Resp 16    Ht 6' 2"  (1.88 m)    Wt 238 lb (108 kg)    SpO2 95%    BMI 30.56 kg/m   Physical Exam Vitals and nursing note reviewed.  Constitutional:      General: He is not in acute distress. HENT:     Head:  Normocephalic and atraumatic.     Right Ear: Tympanic membrane, ear canal and external ear normal.     Left Ear: Tympanic membrane, ear canal and external ear normal.     Nose: Nose normal.     Mouth/Throat:     Mouth: Mucous membranes are moist.     Pharynx: Oropharynx is clear.  Eyes:     Conjunctiva/sclera: Conjunctivae normal.     Pupils: Pupils are equal, round, and reactive to light.  Neck:     Thyroid: No thyromegaly.  Cardiovascular:     Rate and Rhythm: Normal rate and regular rhythm.     Heart sounds: Normal heart sounds. No murmur heard. Pulmonary:     Effort: Pulmonary effort is normal.     Breath sounds: Normal breath sounds.  Abdominal:     General: There is no distension.     Palpations: Abdomen is soft. There is no mass.     Tenderness: There is no abdominal tenderness.     Hernia: There is no hernia in the left inguinal area or right inguinal area.  Genitourinary:  Penis: Normal.      Testes: Normal.  Musculoskeletal:        General: Normal range of motion.     Cervical back: Normal range of motion and neck supple.     Right lower leg: No edema.     Left lower leg: No edema.  Skin:    General: Skin is warm and dry.  Neurological:     General: No focal deficit present.     Mental Status: He is alert and oriented to person, place, and time. Mental status is at baseline.  Psychiatric:        Mood and Affect: Mood normal.        Behavior: Behavior normal.    Assessment & Plan:   1. Annual physical exam Routine labs ordered.  - CMP14+EGFR - CBC with Differential  2. Lipid screening  - Lipid Panel  3. Screening for endocrine/metabolic/immunity disorders  - Hemoglobin A1c - TSH  4. Need for hepatitis C screening test  - Hepatitis C Antibody  5. Encounter to establish care     Outpatient Encounter Medications as of 10/13/2021  Medication Sig   meclizine (ANTIVERT) 12.5 MG tablet Take 1 tablet (12.5 mg total) by mouth 3 (three) times daily  as needed for dizziness.   No facility-administered encounter medications on file as of 10/13/2021.    Follow-up: No follow-ups on file.   Becky Sax, MD

## 2021-12-29 ENCOUNTER — Telehealth: Payer: BC Managed Care – PPO | Admitting: Physician Assistant

## 2021-12-29 DIAGNOSIS — U071 COVID-19: Secondary | ICD-10-CM

## 2021-12-29 MED ORDER — NIRMATRELVIR/RITONAVIR (PAXLOVID)TABLET: 3.0000 | ORAL_TABLET | Freq: Two times a day (BID) | ORAL | 0 refills | Status: AC

## 2021-12-29 NOTE — Patient Instructions (Signed)
Gaylyn Cheerslifford J Lachapelle, thank you for joining Margaretann LovelessJennifer M Emslee Lopezmartinez, PA-C for today's virtual visit.  While this provider is not your primary care provider (PCP), if your PCP is located in our provider database this encounter information will be shared with them immediately following your visit. ? ?Consent: ?(Patient) Gaylyn Cheerslifford J Lacks provided verbal consent for this virtual visit at the beginning of the encounter. ? ?Current Medications: ? ?Current Outpatient Medications:  ?  nirmatrelvir/ritonavir EUA (PAXLOVID) 20 x 150 MG & 10 x 100MG  TABS, Take 3 tablets by mouth 2 (two) times daily for 5 days. (Take nirmatrelvir 150 mg two tablets twice daily for 5 days and ritonavir 100 mg one tablet twice daily for 5 days) Patient GFR is 103, Disp: 30 tablet, Rfl: 0 ?  fenofibrate (TRICOR) 145 MG tablet, Take 1 tablet (145 mg total) by mouth daily., Disp: 90 tablet, Rfl: 0 ?  meclizine (ANTIVERT) 12.5 MG tablet, Take 1 tablet (12.5 mg total) by mouth 3 (three) times daily as needed for dizziness., Disp: 30 tablet, Rfl: 0 ?  rosuvastatin (CRESTOR) 20 MG tablet, Take 1 tablet (20 mg total) by mouth daily., Disp: 90 tablet, Rfl: 0  ? ?Medications ordered in this encounter:  ?Meds ordered this encounter  ?Medications  ? nirmatrelvir/ritonavir EUA (PAXLOVID) 20 x 150 MG & 10 x 100MG  TABS  ?  Sig: Take 3 tablets by mouth 2 (two) times daily for 5 days. (Take nirmatrelvir 150 mg two tablets twice daily for 5 days and ritonavir 100 mg one tablet twice daily for 5 days) Patient GFR is 103  ?  Dispense:  30 tablet  ?  Refill:  0  ?  Order Specific Question:   Supervising Provider  ?  Answer:   Eber HongMILLER, BRIAN [[id number]]  ?  ? ?*If you need refills on other medications prior to your next appointment, please contact your pharmacy* ? ?Follow-Up: ?Call back or seek an in-person evaluation if the symptoms worsen or if the condition fails to improve as anticipated. ? ?Other Instructions ?Nirmatrelvir; Ritonavir Tablets ?What is this  medication? ?NIRMATRELVIR; RITONAVIR (NIR ma TREL vir; ri TOE na veer) treats mild to moderate COVID-19. It may help people who are at high risk of developing severe illness. This medication works by limiting the spread of the virus in your body. The FDA has allowed the emergency use of this medication. ?This medicine may be used for other purposes; ask your health care provider or pharmacist if you have questions. ?COMMON BRAND NAME(S): PAXLOVID ?What should I tell my care team before I take this medication? ?They need to know if you have any of these conditions: ?Any allergies ?Any serious illness ?Kidney disease ?Liver disease ?An unusual or allergic reaction to nirmatrelvir, ritonavir, other medications, foods, dyes, or preservatives ?Pregnant or trying to get pregnant ?Breast-feeding ?How should I use this medication? ?This product contains 2 different medications that are packaged together. For the standard dose, take 2 pink tablets of nirmatrelvir with 1 white tablet of ritonavir (3 tablets total) by mouth with water twice daily. Talk to your care team if you have kidney disease. You may need a different dose. Swallow the tablets whole. You can take it with or without food. If it upsets your stomach, take it with food. Take all of this medication unless your care team tells you to stop it early. Keep taking it even if you think you are better. ?Talk to your care team about the use of this medication in children. While  it may be prescribed for children as young as 12 years for selected conditions, precautions do apply. ?Overdosage: If you think you have taken too much of this medicine contact a poison control center or emergency room at once. ?NOTE: This medicine is only for you. Do not share this medicine with others. ?What if I miss a dose? ?If you miss a dose, take it as soon as you can unless it is more than 8 hours late. If it is more than 8 hours late, skip the missed dose. Take the next dose at the  normal time. Do not take extra or 2 doses at the same time to make up for the missed dose. ?What may interact with this medication? ?Do not take this medication with any of the following medications: ?Alfuzosin ?Certain medications for anxiety or sleep like midazolam, triazolam ?Certain medications for cancer like apalutamide, enzalutamide ?Certain medications for cholesterol like lovastatin, simvastatin ?Certain medications for irregular heart beat like amiodarone, dronedarone, flecainide, propafenone, quinidine ?Certain medications for pain like meperidine, piroxicam ?Certain medications for psychotic disorders like clozapine, lurasidone, pimozide ?Certain medications for seizures like carbamazepine, phenobarbital, phenytoin ?Colchicine ?Eletriptan ?Eplerenone ?Ergot alkaloids like dihydroergotamine, ergonovine, ergotamine, methylergonovine ?Finerenone ?Flibanserin ?Ivabradine ?Lomitapide ?Naloxegol ?Ranolazine ?Rifampin ?Sildenafil ?Silodosin ?St. John's Wort ?Tolvaptan ?Ubrogepant ?Voclosporin ?This medication may also interact with the following medications: ?Bedaquiline ?Birth control pills ?Bosentan ?Certain antibiotics like erythromycin or clarithromycin ?Certain medications for blood pressure like amlodipine, diltiazem, felodipine, nicardipine, nifedipine ?Certain medications for cancer like abemaciclib, ceritinib, dasatinib, encorafenib, ibrutinib, ivosidenib, neratinib, nilotinib, venetoclax, vinblastine, vincristine ?Certain medications for cholesterol like atorvastatin, rosuvastatin ?Certain medications for depression like bupropion, trazodone ?Certain medications for fungal infections like isavuconazonium, itraconazole, ketoconazole, voriconazole ?Certain medications for hepatitis C like elbasvir; grazoprevir, dasabuvir; ombitasvir; paritaprevir; ritonavir, glecaprevir; pibrentasvir, sofosbuvir; velpatasvir; voxilaprevir ?Certain medications for HIV or AIDS ?Certain medications for irregular heartbeat  like lidocaine ?Certain medications that treat or prevent blood clots like rivaroxaban, warfarin ?Digoxin ?Fentanyl ?Medications that lower your chance of fighting infection like cyclosporine, sirolimus, tacrolimus ?Methadone ?Quetiapine ?Rifabutin ?Salmeterol ?Steroid medications like betamethasone, budesonide, ciclesonide, dexamethasone, fluticasone, methylprednisolone, mometasone, triamcinolone ?This list may not describe all possible interactions. Give your health care provider a list of all the medicines, herbs, non-prescription drugs, or dietary supplements you use. Also tell them if you smoke, drink alcohol, or use illegal drugs. Some items may interact with your medicine. ?What should I watch for while using this medication? ?Your condition will be monitored carefully while you are receiving this medication. Visit your care team for regular checkups. Tell your care team if your symptoms do not start to get better or if they get worse. ?If you have untreated HIV infection, this medication may lead to some HIV medications not working as well in the future. ?Birth control may not work properly while you are taking this medication. Talk to your care team about using an extra method of birth control. ?What side effects may I notice from receiving this medication? ?Side effects that you should report to your care team as soon as possible: ?Allergic reactions--skin rash, itching, hives, swelling of the face, lips, tongue, or throat ?Liver injury--right upper belly pain, loss of appetite, nausea, light-colored stool, dark yellow or brown urine, yellowing skin or eyes, unusual weakness or fatigue ?Redness, blistering, peeling, or loosening of the skin, including inside the mouth ?Side effects that usually do not require medical attention (report these to your care team if they continue or are bothersome): ?Change in taste ?Diarrhea ?General discomfort  and fatigue ?Increase in blood pressure ?Muscle  pain ?Nausea ?Stomach pain ?This list may not describe all possible side effects. Call your doctor for medical advice about side effects. You may report side effects to FDA at 1-800-FDA-1088. ?Where should I keep my medication? ?Keep out of th

## 2021-12-29 NOTE — Progress Notes (Signed)
?Virtual Visit Consent  ? ?[name], you are scheduled for a virtual visit with a [facility] provider today.   ?  ?Just as with appointments in the office, your consent must be obtained to participate.  Your consent will be active for this visit and any virtual visit you may have with one of our providers in the next 365 days.   ?  ?If you have a MyChart account, a copy of this consent can be sent to you electronically.  All virtual visits are billed to your insurance company just like a traditional visit in the office.   ? ?As this is a virtual visit, video technology does not allow for your provider to perform a traditional examination.  This may limit your provider's ability to fully assess your condition.  If your provider identifies any concerns that need to be evaluated in person or the need to arrange testing (such as labs, EKG, etc.), we will make arrangements to do so.   ?  ?Although advances in technology are sophisticated, we cannot ensure that it will always work on either your end or our end.  If the connection with a video visit is poor, the visit may have to be switched to a telephone visit.  With either a video or telephone visit, we are not always able to ensure that we have a secure connection.    ? ?I need to obtain your verbal consent now.   Are you willing to proceed with your visit today?  ?  ?[name] has provided verbal consent on 12/29/2021 for a virtual visit (video or telephone). ?  ?Margaretann Loveless, PA-C  ? ?Date: 12/29/2021 10:50 AM ? ? ?Virtual Visit via Video Note  ? ?[name], connected with  [name]  ([id number], September 19, 1980) on 12/29/21 at 10:45 AM EDT by a video-enabled telemedicine application and verified that I am speaking with the correct person using two identifiers. ? ?Location: ?Patient: Virtual Visit Location Patient: Home ?Provider: Home office ?  ?I discussed the limitations of evaluation and management by telemedicine and the  availability of in person appointments. The patient expressed understanding and agreed to proceed.   ? ?History of Present Illness: ?[name] is a 41 y.o. who identifies as a male who was assigned male at birth, and is being seen today for Covid 54. ? ?HPI: URI  ?This is a new problem. Episode onset: tested positive for Covid 19 and symptoms started yesterday. The problem has been gradually worsening. Maximum temperature: low grade. Associated symptoms include congestion, coughing (mild), headaches, rhinorrhea and sinus pain. Pertinent negatives include no diarrhea, ear pain, nausea, plugged ear sensation, sore throat or vomiting. Associated symptoms comments: Fatigue, decreased appetite. He has tried nothing for the symptoms. The treatment provided no relief.   ? ? ?Problems: There are no problems to display for this patient. ?  ?Allergies: No Known Allergies ?Medications:  ?Current Outpatient Medications:  ?  nirmatrelvir/ritonavir EUA (PAXLOVID) 20 x 150 MG & 10 x 100MG  TABS, Take 3 tablets by mouth 2 (two) times daily for 5 days. (Take nirmatrelvir 150 mg two tablets twice daily for 5 days and ritonavir 100 mg one tablet twice daily for 5 days) Patient GFR is 103, Disp: 30 tablet, Rfl: 0 ?  fenofibrate (TRICOR) 145 MG tablet, Take 1 tablet (145 mg total) by mouth daily., Disp: 90 tablet, Rfl: 0 ?  meclizine (ANTIVERT) 12.5 MG tablet, Take 1 tablet (12.5 mg total)  by mouth 3 (three) times daily as needed for dizziness., Disp: 30 tablet, Rfl: 0 ?  rosuvastatin (CRESTOR) 20 MG tablet, Take 1 tablet (20 mg total) by mouth daily., Disp: 90 tablet, Rfl: 0 ? ?Observations/Objective: ?Patient is well-developed, well-nourished in no acute distress.  ?Resting comfortably at home.  ?Head is normocephalic, atraumatic.  ?No labored breathing.  ?Speech is clear and coherent with logical content.  ?Patient is alert and oriented at baseline.  ? ? ?Assessment and Plan: ?1. COVID-19 ?- nirmatrelvir/ritonavir EUA (PAXLOVID)  20 x 150 MG & 10 x 100MG  TABS; Take 3 tablets by mouth 2 (two) times daily for 5 days. (Take nirmatrelvir 150 mg two tablets twice daily for 5 days and ritonavir 100 mg one tablet twice daily for 5 days) Patient GFR is 103  Dispense: 30 tablet; Refill: 0 ? ?- Continue OTC symptomatic management of choice ?- Will send OTC vitamins and supplement information through AVS ?- Paxlovid prescribed ?- Patient enrolled in MyChart symptom monitoring ?- Push fluids ?- Rest as needed ?- Discussed return precautions and when to seek in-person evaluation, sent via AVS as well ? ?Follow Up Instructions: ?I discussed the assessment and treatment plan with the patient. The patient was provided an opportunity to ask questions and all were answered. The patient agreed with the plan and demonstrated an understanding of the instructions.  A copy of instructions were sent to the patient via MyChart unless otherwise noted below. \ ? ?The patient was advised to call back or seek an in-person evaluation if the symptoms worsen or if the condition fails to improve as anticipated. ? ?Time:  ?I spent 12 minutes with the patient via telehealth technology discussing the above problems/concerns.   ? ? , PA-C ?

## 2021-12-30 ENCOUNTER — Encounter: Payer: Self-pay | Admitting: Family Medicine

## 2021-12-30 ENCOUNTER — Encounter: Payer: Self-pay | Admitting: Physician Assistant

## 2021-12-31 ENCOUNTER — Encounter: Payer: Self-pay | Admitting: Family Medicine

## 2021-12-31 ENCOUNTER — Ambulatory Visit: Payer: BC Managed Care – PPO | Admitting: Family Medicine

## 2021-12-31 VITALS — BP 122/84 | HR 91 | Temp 98.2°F | Resp 16 | Wt 229.3 lb

## 2021-12-31 DIAGNOSIS — U071 COVID-19: Secondary | ICD-10-CM | POA: Diagnosis not present

## 2021-12-31 DIAGNOSIS — Z20818 Contact with and (suspected) exposure to other bacterial communicable diseases: Secondary | ICD-10-CM

## 2021-12-31 MED ORDER — AMOXICILLIN 875 MG PO TABS: 875.0000 mg | ORAL_TABLET | Freq: Two times a day (BID) | ORAL | 0 refills | Status: AC

## 2021-12-31 NOTE — Progress Notes (Signed)
Patient is here for possible strep. ?Patient said that he is positive for COVID but taking medication. Patient said family has strep and would like to be tested for that aswell. ?

## 2022-01-01 ENCOUNTER — Encounter: Payer: Self-pay | Admitting: Family Medicine

## 2022-01-01 NOTE — Progress Notes (Signed)
? ?Established Patient Office Visit ? ?Subjective   ? ?Patient ID: [name], male    DOB: May 09, 1981  Age: 41 y.o. MRN: [id number] ? ?CC: No chief complaint on file. ? ? ?HPI ?[name] presents with complaint of positive covid and is on paxlovid. He still feels ill. He also reports that his wife and all his children have strep and are being treated and he would like to be treated for the same.  ? ? ?Outpatient Encounter Medications as of 12/31/2021  ?Medication Sig  ? amoxicillin (AMOXIL) 875 MG tablet Take 1 tablet (875 mg total) by mouth 2 (two) times daily for 7 days.  ? fenofibrate (TRICOR) 145 MG tablet Take 1 tablet (145 mg total) by mouth daily.  ? meclizine (ANTIVERT) 12.5 MG tablet Take 1 tablet (12.5 mg total) by mouth 3 (three) times daily as needed for dizziness.  ? nirmatrelvir/ritonavir EUA (PAXLOVID) 20 x 150 MG & 10 x 100MG  TABS Take 3 tablets by mouth 2 (two) times daily for 5 days. (Take nirmatrelvir 150 mg two tablets twice daily for 5 days and ritonavir 100 mg one tablet twice daily for 5 days) Patient GFR is 103  ? rosuvastatin (CRESTOR) 20 MG tablet Take 1 tablet (20 mg total) by mouth daily.  ? ?No facility-administered encounter medications on file as of 12/31/2021.  ? ? ?No past medical history on file. ? ?Past Surgical History:  ?Procedure Laterality Date  ? APPENDECTOMY    ? COSMETIC SURGERY    ? NOSE  ? KNEE SURGERY    ? ? ?Family History  ?Problem Relation Age of Onset  ? Alzheimer's disease Mother   ? Diabetes Father   ? Alcohol abuse Father   ? ? ?Social History  ? ?Socioeconomic History  ? Marital status: Married  ?  Spouse name: Not on file  ? Number of children: 0  ? Years of education: Not on file  ? Highest education level: Not on file  ?Occupational History  ? Occupation: [employer]  ?Tobacco Use  ? Smoking status: Never  ? Smokeless tobacco: Current  ?  Types: Chew  ?Substance and Sexual Activity  ? Alcohol use: Yes  ? Drug use: No  ? Sexual activity: Not on file  ?Other  Topics Concern  ? Not on file  ?Social History Narrative  ? Regular exercise- no  ? Diet: Fruits and veggies, water  ? ?Social Determinants of Health  ? ?Financial Resource Strain: Not on file  ?Food Insecurity: Not on file  ?Transportation Needs: Not on file  ?Physical Activity: Not on file  ?Stress: Not on file  ?Social Connections: Not on file  ?Intimate Partner Violence: Not on file  ? ? ?Review of Systems  ?Constitutional:  Positive for fever and malaise/fatigue. Negative for chills.  ?HENT:  Positive for sore throat.   ?All other systems reviewed and are negative. ? ?  ? ? ?Objective   ? ?BP 122/84   Pulse 91   Temp 98.2 ?F (36.8 ?C) (Temporal)   Resp 16   Wt 229 lb 4.8 oz (104 kg)   SpO2 94%   BMI 29.44 kg/m?  ? ?Physical Exam ?Vitals and nursing note reviewed.  ?Constitutional:   ?   General: He is not in acute distress. ?Cardiovascular:  ?   Rate and Rhythm: Normal rate and regular rhythm.  ?Pulmonary:  ?   Effort: Pulmonary effort is normal.  ?   Breath sounds: Normal breath sounds.  ?Neurological:  ?  General: No focal deficit present.  ?   Mental Status: He is alert and oriented to person, place, and time.  ? ? ? ?  ? ?Assessment & Plan:  ? ?1. Exposure to strep throat ?Amox prescribed.  ? ?2. COVID-19 ?Continue paxlovid, rest, and fluids.  ? ? ?No follow-ups on file.  ? ?Becky Sax, MD ? ? ?

## 2022-10-20 ENCOUNTER — Ambulatory Visit: Payer: BC Managed Care – PPO | Admitting: Family Medicine

## 2022-10-20 VITALS — BP 125/80 | HR 84 | Temp 98.1°F | Resp 16 | Wt 229.0 lb

## 2022-10-20 DIAGNOSIS — Z20818 Contact with and (suspected) exposure to other bacterial communicable diseases: Secondary | ICD-10-CM

## 2022-10-20 NOTE — Progress Notes (Signed)
    Established Patient Office Visit  Subjective    Patient ID: [name], male    DOB: 06/24/1981  Age: 42 y.o. MRN: [id number]  CC: No chief complaint on file.   HPI [name] presents for exposure to strep. Wife and children are strep positive. Patient denies any acute symptoms.    No outpatient encounter medications on file as of 10/20/2022.   No facility-administered encounter medications on file as of 10/20/2022.    No past medical history on file.  Past Surgical History:  Procedure Laterality Date  . APPENDECTOMY    . COSMETIC SURGERY     NOSE  . KNEE SURGERY      Family History  Problem Relation Age of Onset  . Alzheimer's disease Mother   . Diabetes Father   . Alcohol abuse Father     Social History   Socioeconomic History  . Marital status: Married    Spouse name: Not on file  . Number of children: 0  . Years of education: Not on file  . Highest education level: Not on file  Occupational History  . Occupation: Farmer  Tobacco Use  . Smoking status: Never  . Smokeless tobacco: Current    Types: Chew  Substance and Sexual Activity  . Alcohol use: Yes  . Drug use: No  . Sexual activity: Not on file  Other Topics Concern  . Not on file  Social History Narrative   Regular exercise- no   Diet: Fruits and veggies, water   Social Determinants of Health   Financial Resource Strain: Not on file  Food Insecurity: Not on file  Transportation Needs: Not on file  Physical Activity: Not on file  Stress: Not on file  Social Connections: Not on file  Intimate Partner Violence: Not on file    Review of Systems  All other systems reviewed and are negative.       Objective    BP 125/80   Pulse 84   Temp 98.1 F (36.7 C) (Oral)   Resp 16   Wt 229 lb (103.9 kg)   SpO2 95%   BMI 29.40 kg/m   Physical Exam Vitals and nursing note reviewed.  Constitutional:      General: He is not in acute distress. HENT:     Mouth/Throat:      Mouth: Mucous membranes are moist.     Pharynx: No oropharyngeal exudate.  Cardiovascular:     Rate and Rhythm: Normal rate and regular rhythm.  Pulmonary:     Effort: Pulmonary effort is normal.     Breath sounds: Normal breath sounds.  Neurological:     General: No focal deficit present.     Mental Status: He is alert and oriented to person, place, and time.    {Labs (Optional):23779}    Assessment & Plan:   1. Exposure to strep throat *** - POCT rapid strep A - Culture, Group A Strep  No follow-ups on file.   Becky Sax, MD

## 2022-10-22 ENCOUNTER — Encounter: Payer: Self-pay | Admitting: Family Medicine

## 2022-10-23 LAB — CULTURE, GROUP A STREP: Strep A Culture: NEGATIVE
